# Patient Record
Sex: Male | Born: 1980 | Race: White | Hispanic: No | Marital: Single | Smoking: Current every day smoker
Health system: Southern US, Community
[De-identification: ages and names within clinical notes are randomized; demographics above are authoritative.]

## PROBLEM LIST (undated history)

## (undated) DIAGNOSIS — K219 Gastro-esophageal reflux disease without esophagitis: Secondary | ICD-10-CM

## (undated) DIAGNOSIS — A6920 Lyme disease, unspecified: Secondary | ICD-10-CM

## (undated) HISTORY — PX: CHOLECYSTECTOMY: SHX55

## (undated) HISTORY — PX: REPAIR OF PERFORATED ULCER: SHX6065

## (undated) HISTORY — PX: HERNIA REPAIR: SHX51

---

## 2015-05-15 ENCOUNTER — Emergency Department (HOSPITAL_COMMUNITY)
Admission: EM | Admit: 2015-05-15 | Discharge: 2015-05-15 | Disposition: A | Discharge status: Home / Self Care | Payer: Self-pay | Attending: Emergency Medicine | Admitting: Emergency Medicine

## 2015-05-15 ENCOUNTER — Encounter (HOSPITAL_COMMUNITY): Payer: Self-pay

## 2015-05-15 ENCOUNTER — Emergency Department (HOSPITAL_COMMUNITY): Payer: Self-pay

## 2015-05-15 DIAGNOSIS — S61209A Unspecified open wound of unspecified finger without damage to nail, initial encounter: Secondary | ICD-10-CM

## 2015-05-15 DIAGNOSIS — Y9389 Activity, other specified: Secondary | ICD-10-CM | POA: Insufficient documentation

## 2015-05-15 DIAGNOSIS — Y9289 Other specified places as the place of occurrence of the external cause: Secondary | ICD-10-CM | POA: Insufficient documentation

## 2015-05-15 DIAGNOSIS — Y288XXA Contact with other sharp object, undetermined intent, initial encounter: Secondary | ICD-10-CM | POA: Insufficient documentation

## 2015-05-15 DIAGNOSIS — S56429A Laceration of extensor muscle, fascia and tendon of unspecified finger at forearm level, initial encounter: Secondary | ICD-10-CM

## 2015-05-15 DIAGNOSIS — S66322A Laceration of extensor muscle, fascia and tendon of right middle finger at wrist and hand level, initial encounter: Secondary | ICD-10-CM | POA: Insufficient documentation

## 2015-05-15 DIAGNOSIS — Y998 Other external cause status: Secondary | ICD-10-CM | POA: Insufficient documentation

## 2015-05-15 HISTORY — DX: Lyme disease, unspecified: A69.20

## 2015-05-15 MED ORDER — BUPIVACAINE HCL (PF) 0.25 % IJ SOLN
10.0000 mL | Freq: Once | INTRAMUSCULAR | Status: AC
  Administered 2015-05-15: 10 mL
  Filled 2015-05-15: qty 10

## 2015-05-15 MED ORDER — HYDROCODONE-ACETAMINOPHEN 5-325 MG PO TABS: 1.0000 | ORAL_TABLET | Freq: Four times a day (QID) | ORAL | Status: AC | PRN

## 2015-05-15 MED ORDER — CEPHALEXIN 500 MG PO CAPS: 500.0000 mg | ORAL_CAPSULE | Freq: Four times a day (QID) | ORAL | Status: DC

## 2015-05-15 MED ORDER — BUPIVACAINE HCL 0.25 % IJ SOLN
10.0000 mL | Freq: Once | INTRAMUSCULAR | Status: DC
  Filled 2015-05-15: qty 10

## 2015-05-15 MED ORDER — IBUPROFEN 600 MG PO TABS: 600.0000 mg | ORAL_TABLET | Freq: Four times a day (QID) | ORAL | Status: AC | PRN

## 2015-05-15 NOTE — ED Notes (Signed)
Per Patient, Patient was cutting metal and it kicked back into his right middle finger. Patient reports end of the finger going limp and being unable to fully move it.

## 2015-05-15 NOTE — ED Provider Notes (Signed)
CSN: [id number]     Arrival date & time 05/15/15  1700 History  This chart was scribed for non-physician provider Jaynie Crumbleatyana Marieta Markov, PA-C, working with Toy CookeyMegan Docherty, MD by Phillis HaggisGabriella Gaje, ED Scribe. This patient was seen in room TR10C/TR10C and patient care was started at 5:05 PM.    Chief Complaint  Patient presents with  . Finger Injury   The history is provided by the patient. No language interpreter was used.   HPI Comments: Darryl Richards is a 34 y.o. male who presents to the Emergency Department complaining of a right middle finger laceration onset PTA. He states that he was cutting a piece of metal that cut his finger and is now unable to move it. He reports working in [employer] near a medical center and they wrapped his finger before he came to the ED. Bleeding is not controlled upon arrival. He denies numbness, weakness or color change. Pt states that he is right hand dominant. Pt states he is UTD on his tdap.   No past medical history on file. No past surgical history on file. No family history on file. History  Substance Use Topics  . Smoking status: Not on file  . Smokeless tobacco: Not on file  . Alcohol Use: Not on file    Review of Systems  Skin: Positive for wound. Negative for color change.  Neurological: Negative for weakness and numbness.   Allergies  Review of patient's allergies indicates not on file.  Home Medications   Prior to Admission medications   Not on File   BP 136/96 mmHg  Pulse 106  Temp(Src) 98.3 F (36.8 C) (Oral)  Resp 20  SpO2 99%  Physical Exam  Constitutional: He is oriented to person, place, and time. He appears well-developed and well-nourished.  HENT:  Head: Normocephalic and atraumatic.  Eyes: EOM are normal.  Neck: Normal range of motion. Neck supple.  Cardiovascular: Normal rate.   Pulmonary/Chest: Effort normal.  Musculoskeletal: Normal range of motion.       Hands: 1cm laceration over dorsal surface of right middle  finger over DIP joint. Finger flexed at rest at DIP joint. Unable to extend finger at DIP joint. Normal flexion and extension at all other joints. Cap refill <2 sec distally. Sensation intact distally over palmar and dorsal surfaces  Neurological: He is alert and oriented to person, place, and time.  Skin: Skin is warm and dry.  Psychiatric: He has a normal mood and affect. His behavior is normal.  Nursing note and vitals reviewed.   ED Course  Procedures (including critical care time) DIAGNOSTIC STUDIES: Oxygen Saturation is 99% on RA, normal by my interpretation.    COORDINATION OF CARE: 5:08 PM-Discussed treatment plan which includes wrapping finger, digital block, call to hand surgeon, and x-Travarius with pt at bedside and pt agreed to plan.   NERVE BLOCK Performed by: Jaynie CrumbleKIRICHENKO, Tanikka Bresnan A Consent: Verbal consent obtained. Required items: required blood products, implants, devices, and special equipment available Time out: Immediately prior to procedure a "time out" was called to verify the correct patient, procedure, equipment, support staff and site/side marked as required.  Indication: finger laceration Nerve block body site: right middle finger  Preparation: Patient was prepped and draped in the usual sterile fashion. Needle gauge: 25 G Location technique: anatomical landmarks  Local anesthetic: bupivacaine 0.25% WO epi  Anesthetic total: 4 ml  Outcome: pain improved Patient tolerance: Patient tolerated the procedure well with no immediate complications.    LACERATION REPAIR Performed by:  Jaynie Crumble, PA-C Consent: Verbal consent obtained. Risks and benefits: risks, benefits and alternatives were discussed Patient identity confirmed: provided demographic data Time out performed prior to procedure Prepped and Draped in normal sterile fashion Wound explored Laceration Location: DIP joint of right middle finger Laceration Length: 1 cm No Foreign Bodies seen or  palpated Anesthesia: digital block  Local anesthetic: bupivacaine 0.25% wo epinephrine Anesthetic total: 4 ml Irrigation method: syringe Amount of cleaning: standard Skin closure: prolene 5.0 Number of sutures or staples: 3 Technique: simple interrupted Patient tolerance: Patient tolerated the procedure well with no immediate complications.  Labs Review Labs Reviewed - No data to display  Imaging Review Dg Finger Middle Right  05/15/2015   CLINICAL DATA:  Laceration to the dorsal aspect of the right long finger just below the fingernail earlier today while cutting metal at work. Initial encounter.  EXAM: RIGHT MIDDLE FINGER 2+V  COMPARISON:  None.  FINDINGS: Examination was performed with overlying bandage material. No evidence of acute fracture or dislocation. Well preserved joint spaces. Well preserved bone mineral density. No intrinsic osseous abnormality. No opaque foreign bodies in the soft tissues.  IMPRESSION: No osseous abnormality. No opaque foreign bodies in the soft tissues.   Electronically Signed   By: Hulan Saas M.D.   On: 05/15/2015 18:42     EKG Interpretation None      MDM   Final diagnoses:  Extensor tendon laceration of finger with open wound, initial encounter   Patient with laceration to the distal right middle finger, with extensor tendon injury. I discussed patient with Dr. Merlyn Lot who recommended closing the wound, irrigating it, antibiotics, follow up with him in the office tomorrow. Wound thoroughly irrigated and scrubbed with surgical scrub. Repaired with sutures. Placed in a splint. Follow-up with Dr. Merlyn Lot tomorrow.  Pt states his tetanus is up to date. Asking for pain meds. Will give norco for home.   Filed Vitals:   05/15/15 1709  BP: 136/96  Pulse: 106  Temp: 98.3 F (36.8 C)  TempSrc: Oral  Resp: 20  SpO2: 99%   I personally performed the services described in this documentation, which was scribed in my presence. The recorded information  has been reviewed and is accurate.   Jaynie Crumble, PA-C 05/15/15 1951  Toy Cookey, MD 05/17/15 0140

## 2015-05-15 NOTE — Discharge Instructions (Signed)
Keep your finger in a splint. Keep it clean, dressing intact. Follow-up with Dr. Merlyn LotKuzma tomorrow, call first thing in the morning when their office opens at 8 AM. Follow-up with him in the office, explained to the office staff fever seen here today in Dr. Merlyn LotKuzma wants to see you tomorrow. Ibuprofen for pain, Norco for severe pain. Take keflex as prescribed to prevent infection.   Tendon Injury Tendons are strong, cordlike structures that connect muscle to bone. Tendons are made up of woven fibers, like a rope. A tendon injury is a tear (rupture) of the tendon. The rupture may be partial (only a few of the fibers in your tendon rupture) or complete (your entire tendon ruptures). CAUSES  Tendon injuries can be caused by high-stress activities, such as sports. They also can be caused by a repetitive injury or by a single injury from an excessive, rapid force. SYMPTOMS  Symptoms of tendon injury include pain when you move the joint close to the tendon. Other symptoms are swelling, redness, and warmth. DIAGNOSIS  Tendon injuries often can be diagnosed by physical exam. However, sometimes an X-Ansar exam or advanced imaging, such as magnetic resonance imaging (MRI), is necessary to determine the extent of the injury. TREATMENT  Partial tendon ruptures often can be treated with immobilization. A splint, bandage, or removable brace usually is used to immobilize the injured tendon. Most injured tendons need to be immobilized for 1-2 months before they are completely healed. Complete tendon ruptures may require surgical reattachment. Document Released: 12/02/2004 Document Revised: 10/14/2011 Document Reviewed: 01/16/2012 [facility] Patient Information 2015 [employer] [place]. This information is not intended to replace advice given to you by your health care provider. Make sure you discuss any questions you have with your health care provider.

## 2015-05-16 ENCOUNTER — Encounter (HOSPITAL_BASED_OUTPATIENT_CLINIC_OR_DEPARTMENT_OTHER): Payer: Self-pay

## 2015-05-16 ENCOUNTER — Ambulatory Visit (HOSPITAL_BASED_OUTPATIENT_CLINIC_OR_DEPARTMENT_OTHER): Payer: Self-pay | Admitting: Anesthesiology

## 2015-05-16 ENCOUNTER — Encounter (HOSPITAL_BASED_OUTPATIENT_CLINIC_OR_DEPARTMENT_OTHER)
Admission: RE | Disposition: A | Discharge status: Home / Self Care | Payer: Self-pay | Source: Ambulatory Visit | Attending: Orthopedic Surgery

## 2015-05-16 ENCOUNTER — Ambulatory Visit (HOSPITAL_BASED_OUTPATIENT_CLINIC_OR_DEPARTMENT_OTHER)
Admission: RE | Admit: 2015-05-16 | Discharge: 2015-05-16 | Disposition: A | Discharge status: Home / Self Care | Payer: Self-pay | Source: Ambulatory Visit | Attending: Orthopedic Surgery | Admitting: Orthopedic Surgery

## 2015-05-16 ENCOUNTER — Other Ambulatory Visit: Payer: Self-pay | Admitting: Orthopedic Surgery

## 2015-05-16 DIAGNOSIS — X18XXXA Contact with other hot metals, initial encounter: Secondary | ICD-10-CM | POA: Insufficient documentation

## 2015-05-16 DIAGNOSIS — K219 Gastro-esophageal reflux disease without esophagitis: Secondary | ICD-10-CM | POA: Insufficient documentation

## 2015-05-16 DIAGNOSIS — Z79899 Other long term (current) drug therapy: Secondary | ICD-10-CM | POA: Insufficient documentation

## 2015-05-16 DIAGNOSIS — Y9389 Activity, other specified: Secondary | ICD-10-CM | POA: Insufficient documentation

## 2015-05-16 DIAGNOSIS — F17210 Nicotine dependence, cigarettes, uncomplicated: Secondary | ICD-10-CM | POA: Insufficient documentation

## 2015-05-16 DIAGNOSIS — A6920 Lyme disease, unspecified: Secondary | ICD-10-CM | POA: Insufficient documentation

## 2015-05-16 DIAGNOSIS — Y929 Unspecified place or not applicable: Secondary | ICD-10-CM | POA: Insufficient documentation

## 2015-05-16 DIAGNOSIS — S66322A Laceration of extensor muscle, fascia and tendon of right middle finger at wrist and hand level, initial encounter: Secondary | ICD-10-CM | POA: Insufficient documentation

## 2015-05-16 DIAGNOSIS — Y990 Civilian activity done for income or pay: Secondary | ICD-10-CM | POA: Insufficient documentation

## 2015-05-16 HISTORY — DX: Gastro-esophageal reflux disease without esophagitis: K21.9

## 2015-05-16 HISTORY — PX: REPAIR EXTENSOR TENDON: SHX5382

## 2015-05-16 LAB — POCT HEMOGLOBIN-HEMACUE: Hemoglobin: 14.2 g/dL (ref 13.0–17.0)

## 2015-05-16 SURGERY — REPAIR, TENDON, EXTENSOR
Anesthesia: General | Site: Finger | Laterality: Right

## 2015-05-16 MED ORDER — OXYCODONE HCL 5 MG PO TABS: 5.0000 mg | ORAL_TABLET | Freq: Once | ORAL | Status: DC | PRN

## 2015-05-16 MED ORDER — HYDROCODONE-ACETAMINOPHEN 5-325 MG PO TABS: ORAL_TABLET | ORAL | Status: AC

## 2015-05-16 MED ORDER — CHLORHEXIDINE GLUCONATE 4 % EX LIQD: 60.0000 mL | Freq: Once | CUTANEOUS | Status: DC

## 2015-05-16 MED ORDER — MIDAZOLAM HCL 5 MG/5ML IJ SOLN
INTRAMUSCULAR | Status: DC | PRN
  Administered 2015-05-16: 2 mg via INTRAVENOUS

## 2015-05-16 MED ORDER — CEFAZOLIN SODIUM-DEXTROSE 2-3 GM-% IV SOLR
INTRAVENOUS | Status: AC
  Filled 2015-05-16: qty 50

## 2015-05-16 MED ORDER — MIDAZOLAM HCL 2 MG/2ML IJ SOLN: 1.0000 mg | INTRAMUSCULAR | Status: DC | PRN

## 2015-05-16 MED ORDER — KETOROLAC TROMETHAMINE 30 MG/ML IJ SOLN: 30.0000 mg | Freq: Once | INTRAMUSCULAR | Status: DC | PRN

## 2015-05-16 MED ORDER — GLYCOPYRROLATE 0.2 MG/ML IJ SOLN: 0.2000 mg | Freq: Once | INTRAMUSCULAR | Status: DC | PRN

## 2015-05-16 MED ORDER — SCOPOLAMINE 1 MG/3DAYS TD PT72: 1.0000 | MEDICATED_PATCH | Freq: Once | TRANSDERMAL | Status: DC | PRN

## 2015-05-16 MED ORDER — FENTANYL CITRATE (PF) 100 MCG/2ML IJ SOLN: 50.0000 ug | INTRAMUSCULAR | Status: DC | PRN

## 2015-05-16 MED ORDER — FENTANYL CITRATE (PF) 100 MCG/2ML IJ SOLN
INTRAMUSCULAR | Status: AC
  Filled 2015-05-16: qty 6

## 2015-05-16 MED ORDER — DOXYCYCLINE HYCLATE 50 MG PO CAPS: 100.0000 mg | ORAL_CAPSULE | Freq: Two times a day (BID) | ORAL | Status: AC

## 2015-05-16 MED ORDER — MIDAZOLAM HCL 2 MG/2ML IJ SOLN
INTRAMUSCULAR | Status: AC
  Filled 2015-05-16: qty 2

## 2015-05-16 MED ORDER — CEFAZOLIN SODIUM-DEXTROSE 2-3 GM-% IV SOLR
2.0000 g | INTRAVENOUS | Status: AC
Start: 2015-05-17 — End: 2015-05-16
  Administered 2015-05-16: 2 g via INTRAVENOUS

## 2015-05-16 MED ORDER — BUPIVACAINE HCL (PF) 0.25 % IJ SOLN
INTRAMUSCULAR | Status: AC
Start: 2015-05-16 — End: 2015-05-16
  Filled 2015-05-16: qty 30

## 2015-05-16 MED ORDER — LACTATED RINGERS IV SOLN
INTRAVENOUS | Status: DC
  Administered 2015-05-16 (×2): via INTRAVENOUS

## 2015-05-16 MED ORDER — OXYCODONE HCL 5 MG/5ML PO SOLN: 5.0000 mg | Freq: Once | ORAL | Status: DC | PRN

## 2015-05-16 MED ORDER — FENTANYL CITRATE (PF) 100 MCG/2ML IJ SOLN
INTRAMUSCULAR | Status: DC | PRN
  Administered 2015-05-16 (×2): 50 ug via INTRAVENOUS

## 2015-05-16 MED ORDER — HYDROMORPHONE HCL 1 MG/ML IJ SOLN: 0.2500 mg | INTRAMUSCULAR | Status: DC | PRN

## 2015-05-16 MED ORDER — BUPIVACAINE HCL (PF) 0.25 % IJ SOLN
INTRAMUSCULAR | Status: DC | PRN
Start: 2015-05-16 — End: 2015-05-16
  Administered 2015-05-16: 10 mL

## 2015-05-16 MED ORDER — PROPOFOL 10 MG/ML IV BOLUS
INTRAVENOUS | Status: DC | PRN
  Administered 2015-05-16: 200 mg via INTRAVENOUS

## 2015-05-16 MED ORDER — LIDOCAINE HCL (CARDIAC) 20 MG/ML IV SOLN
INTRAVENOUS | Status: DC | PRN
  Administered 2015-05-16: 50 mg via INTRAVENOUS

## 2015-05-16 MED ORDER — PROMETHAZINE HCL 25 MG/ML IJ SOLN: 6.2500 mg | INTRAMUSCULAR | Status: DC | PRN

## 2015-05-16 MED ORDER — DEXAMETHASONE SODIUM PHOSPHATE 4 MG/ML IJ SOLN
INTRAMUSCULAR | Status: DC | PRN
  Administered 2015-05-16: 10 mg via INTRAVENOUS

## 2015-05-16 MED ORDER — ONDANSETRON HCL 4 MG/2ML IJ SOLN
INTRAMUSCULAR | Status: DC | PRN
  Administered 2015-05-16: 4 mg via INTRAVENOUS

## 2015-05-16 MED ORDER — BUPIVACAINE HCL (PF) 0.25 % IJ SOLN
INTRAMUSCULAR | Status: AC
  Filled 2015-05-16: qty 30

## 2015-05-16 SURGICAL SUPPLY — 84 items
BAG DECANTER FOR FLEXI CONT (MISCELLANEOUS) IMPLANT
BANDAGE ELASTIC 3 VELCRO ST LF (GAUZE/BANDAGES/DRESSINGS) ×3 IMPLANT
BENZOIN TINCTURE PRP APPL 2/3 (GAUZE/BANDAGES/DRESSINGS) IMPLANT
BLADE MINI RND TIP GREEN BEAV (BLADE) IMPLANT
BLADE SURG 15 STRL LF DISP TIS (BLADE) ×2 IMPLANT
BLADE SURG 15 STRL SS (BLADE) ×4
BNDG COHESIVE 1X5 TAN STRL LF (GAUZE/BANDAGES/DRESSINGS) ×3 IMPLANT
BNDG CONFORM 2 STRL LF (GAUZE/BANDAGES/DRESSINGS) IMPLANT
BNDG ELASTIC 2 VLCR STRL LF (GAUZE/BANDAGES/DRESSINGS) IMPLANT
BNDG ESMARK 4X9 LF (GAUZE/BANDAGES/DRESSINGS) ×3 IMPLANT
BNDG GAUZE ELAST 4 BULKY (GAUZE/BANDAGES/DRESSINGS) ×3 IMPLANT
CHLORAPREP W/TINT 26ML (MISCELLANEOUS) IMPLANT
CLOSURE WOUND 1/2 X4 (GAUZE/BANDAGES/DRESSINGS)
CORDS BIPOLAR (ELECTRODE) ×3 IMPLANT
COTTONBALL LRG STERILE PKG (GAUZE/BANDAGES/DRESSINGS) IMPLANT
COVER BACK TABLE 60X90IN (DRAPES) ×3 IMPLANT
COVER MAYO STAND STRL (DRAPES) ×3 IMPLANT
CUFF TOURNIQUET SINGLE 18IN (TOURNIQUET CUFF) ×3 IMPLANT
DECANTER SPIKE VIAL GLASS SM (MISCELLANEOUS) IMPLANT
DRAIN TLS ROUND 10FR (DRAIN) IMPLANT
DRAPE EXTREMITY T 121X128X90 (DRAPE) ×3 IMPLANT
DRAPE OEC MINIVIEW 54X84 (DRAPES) ×3 IMPLANT
DRAPE SURG 17X23 STRL (DRAPES) ×3 IMPLANT
DRSG PAD ABDOMINAL 8X10 ST (GAUZE/BANDAGES/DRESSINGS) IMPLANT
GAUZE SPONGE 4X4 12PLY STRL (GAUZE/BANDAGES/DRESSINGS) ×3 IMPLANT
GAUZE SPONGE 4X4 16PLY XRAY LF (GAUZE/BANDAGES/DRESSINGS) IMPLANT
GAUZE XEROFORM 1X8 LF (GAUZE/BANDAGES/DRESSINGS) ×3 IMPLANT
GLOVE BIO SURGEON STRL SZ7.5 (GLOVE) ×3 IMPLANT
GLOVE BIOGEL M STRL SZ7.5 (GLOVE) ×3 IMPLANT
GLOVE BIOGEL PI IND STRL 8 (GLOVE) ×2 IMPLANT
GLOVE BIOGEL PI INDICATOR 8 (GLOVE) ×4
GOWN STRL REUS W/ TWL LRG LVL3 (GOWN DISPOSABLE) ×1 IMPLANT
GOWN STRL REUS W/TWL LRG LVL3 (GOWN DISPOSABLE) ×2
K-WIRE .035X4 (WIRE) ×3 IMPLANT
LOOP VESSEL MAXI BLUE (MISCELLANEOUS) IMPLANT
NEEDLE HYPO 22GX1.5 SAFETY (NEEDLE) IMPLANT
NEEDLE HYPO 25X1 1.5 SAFETY (NEEDLE) ×3 IMPLANT
NEEDLE KEITH (NEEDLE) IMPLANT
NS IRRIG 1000ML POUR BTL (IV SOLUTION) ×3 IMPLANT
PACK BASIN DAY SURGERY FS (CUSTOM PROCEDURE TRAY) ×3 IMPLANT
PAD CAST 3X4 CTTN HI CHSV (CAST SUPPLIES) IMPLANT
PAD CAST 4YDX4 CTTN HI CHSV (CAST SUPPLIES) IMPLANT
PADDING CAST ABS 3INX4YD NS (CAST SUPPLIES)
PADDING CAST ABS 4INX4YD NS (CAST SUPPLIES) ×2
PADDING CAST ABS COTTON 3X4 (CAST SUPPLIES) IMPLANT
PADDING CAST ABS COTTON 4X4 ST (CAST SUPPLIES) ×1 IMPLANT
PADDING CAST COTTON 3X4 STRL (CAST SUPPLIES)
PADDING CAST COTTON 4X4 STRL (CAST SUPPLIES)
SLEEVE SCD COMPRESS KNEE MED (MISCELLANEOUS) ×3 IMPLANT
SPLINT FINGER 3.25 911903 (SOFTGOODS) ×3 IMPLANT
SPLINT PLASTER CAST XFAST 3X15 (CAST SUPPLIES) IMPLANT
SPLINT PLASTER CAST XFAST 4X15 (CAST SUPPLIES) IMPLANT
SPLINT PLASTER XTRA FAST SET 4 (CAST SUPPLIES)
SPLINT PLASTER XTRA FASTSET 3X (CAST SUPPLIES)
STOCKINETTE 4X48 STRL (DRAPES) ×3 IMPLANT
STRIP CLOSURE SKIN 1/2X4 (GAUZE/BANDAGES/DRESSINGS) IMPLANT
SUT CHROMIC 5 0 P 3 (SUTURE) IMPLANT
SUT ETHIBOND 3-0 V-5 (SUTURE) IMPLANT
SUT ETHILON 3 0 PS 1 (SUTURE) IMPLANT
SUT ETHILON 4 0 PS 2 18 (SUTURE) IMPLANT
SUT FIBERWIRE 3-0 18 TAPR NDL (SUTURE)
SUT FIBERWIRE 4-0 18 DIAM BLUE (SUTURE)
SUT MERSILENE 2.0 SH NDLE (SUTURE) IMPLANT
SUT MERSILENE 3 0 FS 1 (SUTURE) IMPLANT
SUT MERSILENE 4 0 P 3 (SUTURE) ×3 IMPLANT
SUT MNCRL AB 4-0 PS2 18 (SUTURE) IMPLANT
SUT POLY BUTTON 15MM (SUTURE) IMPLANT
SUT PROLENE 2 0 SH DA (SUTURE) IMPLANT
SUT PROLENE 6 0 P 1 18 (SUTURE) IMPLANT
SUT SILK 2 0 FS (SUTURE) IMPLANT
SUT SILK 4 0 PS 2 (SUTURE) IMPLANT
SUT STEEL 4 0 V 26 (SUTURE) IMPLANT
SUT VIC AB 3-0 PS1 18 (SUTURE)
SUT VIC AB 3-0 PS1 18XBRD (SUTURE) IMPLANT
SUT VIC AB 4-0 P-3 18XBRD (SUTURE) IMPLANT
SUT VIC AB 4-0 P3 18 (SUTURE)
SUT VICRYL 4-0 PS2 18IN ABS (SUTURE) IMPLANT
SUTURE FIBERWR 3-0 18 TAPR NDL (SUTURE) IMPLANT
SUTURE FIBERWR 4-0 18 DIA BLUE (SUTURE) IMPLANT
SYR BULB 3OZ (MISCELLANEOUS) ×3 IMPLANT
SYR CONTROL 10ML LL (SYRINGE) ×3 IMPLANT
TOWEL OR 17X24 6PK STRL BLUE (TOWEL DISPOSABLE) ×6 IMPLANT
TUBE FEEDING 5FR 15 INCH (TUBING) IMPLANT
UNDERPAD 30X30 (UNDERPADS AND DIAPERS) ×3 IMPLANT

## 2015-05-16 NOTE — Discharge Instructions (Signed)

## 2015-05-16 NOTE — Op Note (Signed)
[id number] 

## 2015-05-16 NOTE — Transfer of Care (Signed)
Immediate Anesthesia Transfer of Care Note  Patient: Darryl Richards  Procedure(s) Performed: Procedure(s) with comments: REPAIR EXTENSOR TENDON (Right) - Right Long Finger Repair Extensor Tendon/Pinning of DIP joint  Patient Location: PACU  Anesthesia Type:General  Level of Consciousness: awake, alert  and oriented  Airway & Oxygen Therapy: Patient Spontanous Breathing and Patient connected to face mask oxygen  Post-op Assessment: Report given to RN and Post -op Vital signs reviewed and stable  Post vital signs: Reviewed and stable  Last Vitals:  Filed Vitals:   05/16/15 1327  BP: 127/99  Pulse: 81  Temp: 36.7 C  Resp: 18    Complications: No apparent anesthesia complications

## 2015-05-16 NOTE — Brief Op Note (Signed)
05/16/2015  4:36 PM  PATIENT:  Darryl Richards  34 y.o. male  PRE-OPERATIVE DIAGNOSIS:  Rt.Long finger extensor tendon Laceration  POST-OPERATIVE DIAGNOSIS:  Rt.Long finger extensor tendon Laceration  PROCEDURE:  Procedure(s) with comments: REPAIR EXTENSOR TENDON (Right) - Right Long Finger Repair Extensor Tendon/Pinning of DIP joint  SURGEON:  Surgeon(s) and Role:    * Betha LoaKevin Rashonda Warrior, MD - Primary  PHYSICIAN ASSISTANT:   ASSISTANTS: none   ANESTHESIA:   general  EBL:  Total I/O In: 1000 [I.V.:1000] Out: -   BLOOD ADMINISTERED:none  DRAINS: none   LOCAL MEDICATIONS USED:  MARCAINE     SPECIMEN:  No Specimen  DISPOSITION OF SPECIMEN:  N/A  COUNTS:  YES  TOURNIQUET:   Total Tourniquet Time Documented: Upper Arm (Right) - 26 minutes Total: Upper Arm (Right) - 26 minutes   DICTATION: .Other Dictation: Dictation Number [id number]  PLAN OF CARE: Discharge to home after PACU  PATIENT DISPOSITION:  PACU - hemodynamically stable.

## 2015-05-16 NOTE — Anesthesia Postprocedure Evaluation (Signed)
  Anesthesia Post-op Note  Patient: Darryl Richards Seats  Procedure(s) Performed: Procedure(s) with comments: REPAIR EXTENSOR TENDON (Right) - Right Long Finger Repair Extensor Tendon/Pinning of DIP joint  Patient Location: PACU  Anesthesia Type:General  Level of Consciousness: awake, alert  and oriented  Airway and Oxygen Therapy: Patient Spontanous Breathing  Post-op Pain: none  Post-op Assessment: Post-op Vital signs reviewed              Post-op Vital Signs: Reviewed  Last Vitals:  Filed Vitals:   05/16/15 1719  BP: 128/96  Pulse: 86  Temp: 36.7 C  Resp: 16    Complications: No apparent anesthesia complications

## 2015-05-16 NOTE — Anesthesia Procedure Notes (Signed)
Procedure Name: LMA Insertion Date/Time: 05/16/2015 3:51 PM Performed by: Genevieve NorlanderLINKA, Deshanae Lindo L Pre-anesthesia Checklist: Patient identified, Emergency Drugs available, Suction available and Patient being monitored Patient Re-evaluated:Patient Re-evaluated prior to inductionOxygen Delivery Method: Circle System Utilized Preoxygenation: Pre-oxygenation with 100% oxygen Intubation Type: IV induction Ventilation: Mask ventilation without difficulty LMA: LMA inserted LMA Size: 5.0 Number of attempts: 1 Airway Equipment and Method: Bite block Placement Confirmation: positive ETCO2 Tube secured with: Tape Dental Injury: Teeth and Oropharynx as per pre-operative assessment

## 2015-05-16 NOTE — H&P (Signed)
Darryl Richards is an 34 y.o. male.   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  [facility]  Followed up in office.  Reports no previous injury to right long finger.  Past Medical History  Diagnosis Date  . Lyme disease   . GERD (gastroesophageal reflux disease)     Past Surgical History  Procedure Laterality Date  . Cholecystectomy    . Hernia repair    . Repair of perforated ulcer      History reviewed. No pertinent family history. Social History:  reports that he has been smoking Cigarettes.  He has been smoking about 0.50 packs per day. He has never used smokeless tobacco. He reports that he drinks about 3.6 oz of alcohol per week. He reports that he does not use illicit drugs.  Allergies: No Known Allergies  Medications Prior to Admission  Medication Sig Dispense Refill  . cephALEXin (KEFLEX) 500 MG capsule Take 1 capsule (500 mg total) by mouth 4 (four) times daily. 28 capsule 0  . HYDROcodone-acetaminophen (NORCO) 5-325 MG per tablet Take 1 tablet by mouth every 6 (six) hours as needed for moderate pain. 10 tablet 0  . omeprazole (PRILOSEC) 20 MG capsule Take 20 mg by mouth daily.    . traMADol (ULTRAM) 50 MG tablet Take 50 mg by mouth every 6 (six) hours as needed.    Marland Kitchen. ibuprofen (ADVIL,MOTRIN) 600 MG tablet Take 1 tablet (600 mg total) by mouth every 6 (six) hours as needed. 30 tablet 0    Results for orders placed or performed during the hospital encounter of 05/16/15 (from the past 48 hour(s))  Hemoglobin-hemacue, POC     Status: None   Collection Time: 05/16/15  1:21 PM  Result Value Ref Range   Hemoglobin 14.2 13.0 - 17.0 g/dL    Dg Finger Middle Right  05/15/2015   CLINICAL DATA:  Laceration to the dorsal aspect of the right long finger just below the fingernail earlier today while cutting metal at work. Initial encounter.  EXAM: RIGHT MIDDLE  FINGER 2+V  COMPARISON:  None.  FINDINGS: Examination was performed with overlying bandage material. No evidence of acute fracture or dislocation. Well preserved joint spaces. Well preserved bone mineral density. No intrinsic osseous abnormality. No opaque foreign bodies in the soft tissues.  IMPRESSION: No osseous abnormality. No opaque foreign bodies in the soft tissues.   Electronically Signed   By: Hulan Saashomas  Lawrence M.D.   On: 05/15/2015 18:42     A comprehensive review of systems was negative except for: Respiratory: positive for pneumonia Gastrointestinal: positive for ulcer  Blood pressure 127/99, pulse 81, temperature 98 F (36.7 C), resp. rate 18, height 5' 6.5" (1.689 m), weight 73.029 kg (161 lb), SpO2 100 %.  General appearance: alert, cooperative and appears stated age Head: Normocephalic, without obvious abnormality, atraumatic Neck: supple, symmetrical, trachea midline Resp: clear to auscultation bilaterally Cardio: regular rate and rhythm GI: non tender Extremities: intact sensation and capillary refill all digits.  +epl/fpl/io.  laceration dorsum right long finger at dip joint.  unable to extend dip joint. Pulses: 2+ and symmetric Skin: Skin color, texture, turgor normal. No rashes or lesions Neurologic: Grossly normal Incision/Wound: As above  Assessment/Plan Right long finger laceration with tendon injury.  Recommend OR for exploration and repair tendon with pinning of joint if necessary.  Risks, benefits, and alternatives of surgery were discussed  and the patient agrees with the plan of care.   [name] R 05/16/2015, 2:18 PM

## 2015-05-16 NOTE — Op Note (Signed)
Intra-operative fluoroscopic images in the AP, lateral, and oblique views were taken and evaluated by myself.  Reduction and hardware placement were confirmed.  There was no intraarticular penetration of permanent hardware.  

## 2015-05-16 NOTE — Anesthesia Preprocedure Evaluation (Addendum)
Anesthesia Evaluation  Patient identified by MRN, date of birth, ID band Patient awake    Reviewed: Allergy & Precautions, NPO status , Patient's Chart, lab work & pertinent test results  Airway Mallampati: II  TM Distance: >3 FB Neck ROM: Full    Dental   Pulmonary Current Smoker,  breath sounds clear to auscultation        Cardiovascular negative cardio ROS  Rhythm:Regular Rate:Normal     Neuro/Psych negative neurological ROS     GI/Hepatic Neg liver ROS, GERD-  ,  Endo/Other  negative endocrine ROS  Renal/GU negative Renal ROS     Musculoskeletal Chronic lyme disease.   Abdominal   Peds  Hematology negative hematology ROS (+)   Anesthesia Other Findings   Reproductive/Obstetrics                            Anesthesia Physical Anesthesia Plan  ASA: II  Anesthesia Plan: General   Post-op Pain Management:    Induction: Intravenous  Airway Management Planned: LMA  Additional Equipment:   Intra-op Plan:   Post-operative Plan:   Informed Consent: I have reviewed the patients History and Physical, chart, labs and discussed the procedure including the risks, benefits and alternatives for the proposed anesthesia with the patient or authorized representative who has indicated his/her understanding and acceptance.     Plan Discussed with: CRNA  Anesthesia Plan Comments:         Anesthesia Quick Evaluation

## 2015-05-17 NOTE — Op Note (Signed)
NAMESHAMAL, STRACENER NO.:  [id number]  MEDICAL RECORD NO.:  [id number]  LOCATION:                               FACILITY:  [facility]  PHYSICIAN:  Betha Loa, MD        DATE OF BIRTH:  1981/01/14  DATE OF PROCEDURE:  05/16/2015 DATE OF DISCHARGE:  05/16/2015                              OPERATIVE REPORT   PREOPERATIVE DIAGNOSIS:  Right long finger extensor tendon laceration.  POSTOPERATIVE DIAGNOSIS:  Right long finger extensor tendon laceration with violation of DIP joint.  PROCEDURE:   1. Right long finger irrigation and debridement of DIP joint 2. Right long finger repair of extensor tendon zone 1 laceration 3. Pinning of DIP joint.  SURGEON:  Betha Loa, MD.  ASSISTANT:  None.  ANESTHESIA:  General.  IV FLUIDS:  Per anesthesia flow sheet.  ESTIMATED BLOOD LOSS:  Minimal.  COMPLICATIONS:  None.  SPECIMENS:  None.  TOURNIQUET TIME:  26 minutes.  DISPOSITION:  Stable to PACU.  INDICATIONS:  Mr. Raczkowski is a 34 year old right-hand dominant male who states he was cutting sheet metal yesterday when he lacerated the right long finger.  He was sent to the emergency department where the wound was irrigated and debrided and sutured closed.  He was followed up in the office.  He had inability to extend at the DIP joint.  I recommended operative exploration with repair of extensor tendon and pinning of DIP joint.  Risks, benefits, and alternatives surgery were discussed including risk of blood loss, infection, damage to nerves, vessels, tendons, ligaments, bone, failure of surgery, need for additional surgery, complications with wound healing, continued pain, and lag of the DIP joint.  He voiced understanding of these risks and elected to proceed.  DESCRIPTION OF PROCEDURE:  After being identified preoperatively by myself, the patient and I agreed upon procedure and site of procedure. Surgical site was marked.  The risks, benefits, and alternatives  of surgery were reviewed and wished to proceed.  Surgical consent had been signed.  We gave him IV Ancef as preoperative antibiotic prophylaxis. He was transferred to the operating room and placed on the operating room table in supine position with the right upper extremity on arm board.  General anesthesia was induced by Anesthesiology.  Right upper extremity was prepped and draped in normal sterile orthopedic fashion. Surgical pause was performed between surgeons, Anesthesia, operating staff, and all were in agreement as to the patient, procedure, and site of procedure.  Tourniquet at the proximal aspect of the extremity was inflated to 250 mmHg after exsanguination limb with an Esmarch bandage. The wound was opened.  The sutures had been removed prior to prepping and draping.  The extensor tendon was 100% lacerated in the DIP joint bilaterally.  The wound was extended proximally on both sides to allow better visualization.  This was minimized on the ulnar side.  The tendon was freed up.  There was a small stump left distally that would allow repair.  The wound and PIP joint were copiously irrigated with sterile saline by bulb syringe.  There was small amount of gross contamination of the subcutaneous tissues with a small metallic appearing substance.  This was removed.  The DIP joint was pending pin in full extension with a 0.035-inch K-wire.  C-arm was used in AP and lateral projections for appropriate position of the finger and placement of the pin.  The extensor tendon was then repaired with 4-0 Mersilene in a figure-of- eight fashion.  This reapproximated the tendon edges appropriately.  The skin was closed with 4-0 nylon in a horizontal mattress fashion.  The pin was bent and cut short.  Digital block was performed with 10 mL of 0.25% plain Marcaine to aid in postoperative analgesia.  The wounds were dressed with sterile Xeroform, 4 x 4 and wrapped with a Coban dressing lightly.   Alumafoam splint was placed and wrapped lightly with Coban dressing.  Tourniquet was deflated at 26 minutes.  Fingertips were pink with brisk capillary refill after deflation of tourniquet.  Operative drapes were broken down.  The patient was awoken from anesthesia safely. He was transferred back to stretcher and taken to PACU in stable condition.  I will see him back in the office in 1 week for postoperative followup with Norco 5/325, 1-2 p.o. q.6 hours p.r.n. pain, dispensed #30.  He was also given a prescription for doxycycline 100 mg p.o. b.i.d. x7 days.     Betha LoaKevin Unice Vantassel, MD   ______________________________ Betha LoaKevin Dirck Butch, MD    KK/MEDQ  D:  05/16/2015  T:  05/17/2015  Job:  [id number]

## 2015-05-20 ENCOUNTER — Encounter (HOSPITAL_BASED_OUTPATIENT_CLINIC_OR_DEPARTMENT_OTHER): Payer: Self-pay | Admitting: Orthopedic Surgery

## 2016-05-05 IMAGING — CR DG FINGER MIDDLE 2+V*R*
3 series · 3 of 3 positions shown · non-contrast
Comparison: None.

CLINICAL DATA: Laceration to the dorsal aspect of the right long
finger just below the fingernail earlier today while cutting metal
at work. Initial encounter.

EXAM:
RIGHT MIDDLE FINGER 2+V

[x finger pa right]
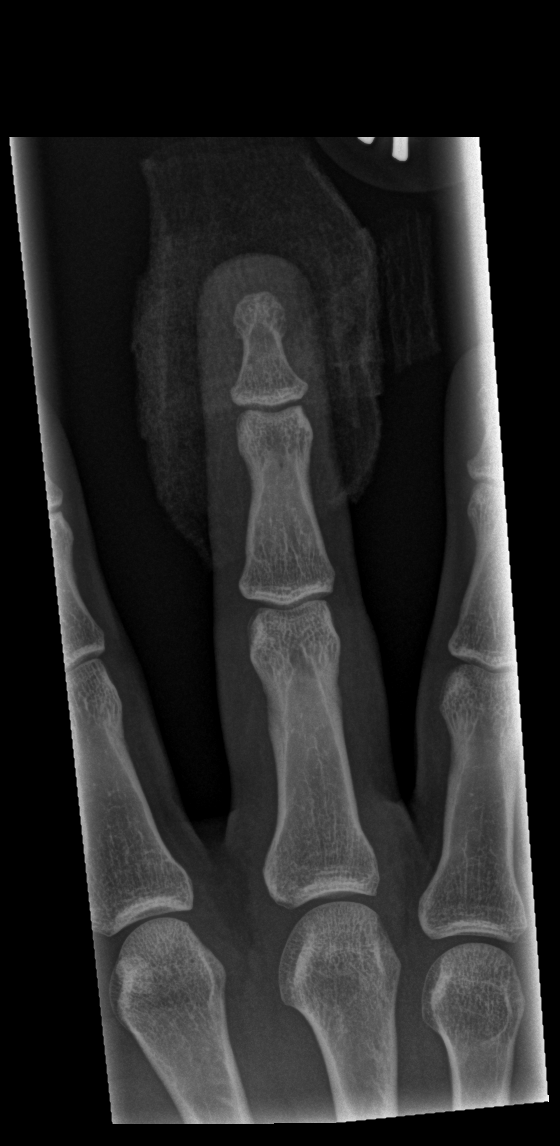

[x finger obl right]
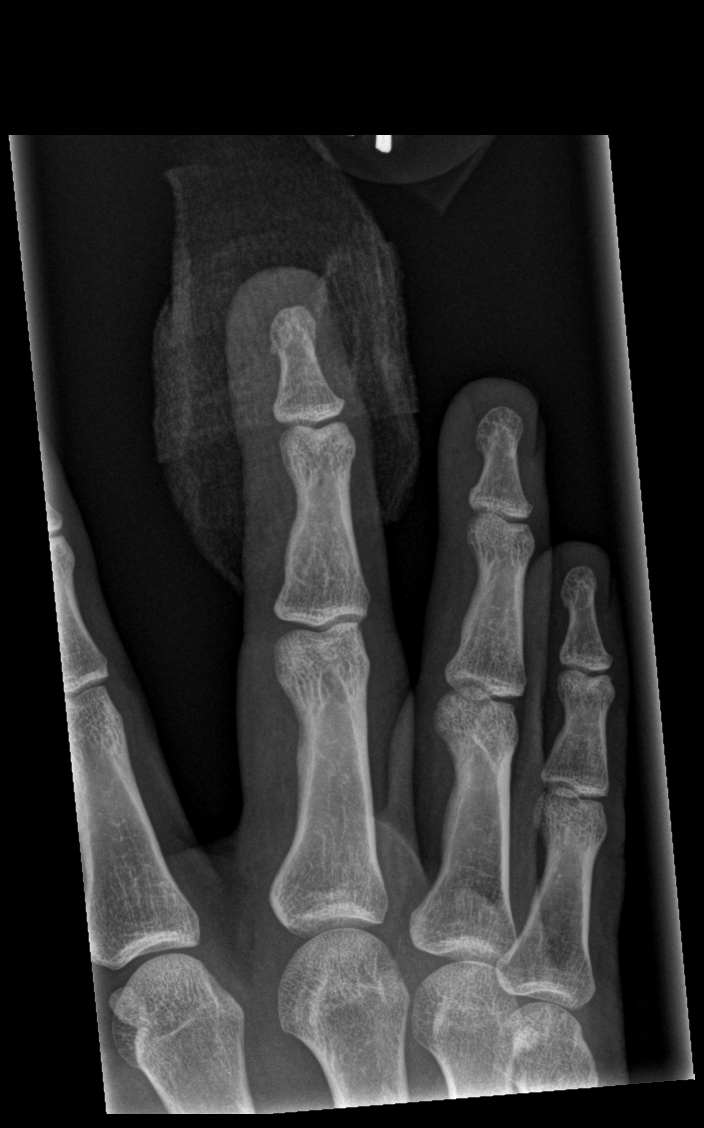

[x finger lat right]
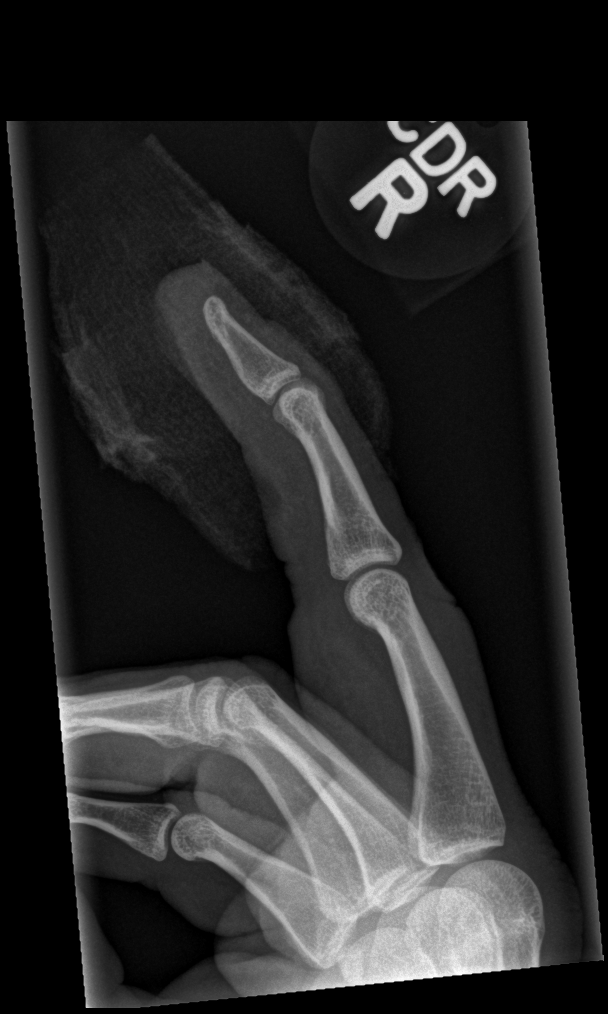

[3 of 3 positions shown; findings below may reference images not displayed]

FINDINGS: Examination was performed with overlying bandage material. No
evidence of acute fracture or dislocation. Well preserved joint
spaces. Well preserved bone mineral density. No intrinsic osseous
abnormality. No opaque foreign bodies in the soft tissues.
IMPRESSION: No osseous abnormality. No opaque foreign bodies in the soft
tissues.
